# Patient Record
Sex: Male | Born: 1990 | Race: White | Hispanic: No | Marital: Single | Smoking: Never smoker
Health system: Southern US, Community
[De-identification: ages and names within clinical notes are randomized; demographics above are authoritative.]

## PROBLEM LIST (undated history)

## (undated) DIAGNOSIS — L709 Acne, unspecified: Secondary | ICD-10-CM

## (undated) DIAGNOSIS — T7840XA Allergy, unspecified, initial encounter: Secondary | ICD-10-CM

## (undated) HISTORY — DX: Allergy, unspecified, initial encounter: T78.40XA

## (undated) HISTORY — DX: Acne, unspecified: L70.9

---
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
## 2013-01-27 ENCOUNTER — Encounter: Payer: Self-pay | Admitting: Internal Medicine

## 2013-01-27 DIAGNOSIS — T7840XA Allergy, unspecified, initial encounter: Secondary | ICD-10-CM | POA: Insufficient documentation

## 2013-01-27 DIAGNOSIS — L709 Acne, unspecified: Secondary | ICD-10-CM | POA: Insufficient documentation

## 2013-02-01 ENCOUNTER — Ambulatory Visit: Payer: Self-pay | Admitting: Internal Medicine

## 2013-02-21 ENCOUNTER — Encounter: Payer: Self-pay | Admitting: Internal Medicine

## 2013-02-21 NOTE — Progress Notes (Signed)
Patient ID: Todd Schwartz, male   DOB: August 31, 1990, 22 y.o.   MRN: [id number]  This encounter was created in error - please disregard.

## 2013-05-01 ENCOUNTER — Ambulatory Visit (INDEPENDENT_AMBULATORY_CARE_PROVIDER_SITE_OTHER): Payer: BC Managed Care – PPO | Admitting: Physician Assistant

## 2013-05-01 ENCOUNTER — Encounter: Payer: Self-pay | Admitting: Physician Assistant

## 2013-05-01 VITALS — BP 120/78 | HR 80 | Temp 98.1°F | Resp 16 | Ht 73.0 in | Wt 175.0 lb

## 2013-05-01 DIAGNOSIS — J029 Acute pharyngitis, unspecified: Secondary | ICD-10-CM

## 2013-05-01 LAB — POCT RAPID STREP A (OFFICE): RAPID STREP A SCREEN: NEGATIVE

## 2013-05-01 MED ORDER — ACETAMINOPHEN-CODEINE #3 300-30 MG PO TABS: 1.0000 | ORAL_TABLET | Freq: Four times a day (QID) | ORAL | Status: DC | PRN

## 2013-05-01 MED ORDER — AZITHROMYCIN 250 MG PO TABS: 250.0000 mg | ORAL_TABLET | Freq: Every day | ORAL | Status: DC

## 2013-05-01 NOTE — Progress Notes (Signed)
   Subjective:    Patient ID: Todd Schwartz, male    DOB: 20-Jan-1991, 22 y.o.   MRN: [id number]  Sore Throat  This is a new problem. The current episode started yesterday. The problem has been gradually worsening. The maximum temperature recorded prior to his arrival was 102 - 102.9 F. The fever has been present for less than 1 day. Associated symptoms include headaches, swollen glands, trouble swallowing and vomiting (once). Pertinent negatives include no abdominal pain, congestion, coughing, diarrhea, drooling, ear discharge, ear pain, hoarse voice, plugged ear sensation, neck pain, shortness of breath or stridor. Exposure to: Pneumonia. He has tried NSAIDs for the symptoms. The treatment provided mild relief.    Review of Systems  Constitutional: Positive for fever and chills. Negative for fatigue.  HENT: Positive for sore throat and trouble swallowing. Negative for congestion, drooling, ear discharge, ear pain, hoarse voice, mouth sores, nosebleeds, postnasal drip, rhinorrhea and sinus pressure.   Eyes: Negative.   Respiratory: Negative for cough, chest tightness, shortness of breath and stridor.   Cardiovascular: Negative.   Gastrointestinal: Positive for vomiting (once). Negative for abdominal pain and diarrhea.  Genitourinary: Negative.   Musculoskeletal: Negative for neck pain.  Neurological: Positive for headaches.       Objective:   Physical Exam  Constitutional: He appears well-developed and well-nourished.  HENT:  Head: Normocephalic and atraumatic.  Right Ear: External ear normal.  Left Ear: External ear normal.  Mouth/Throat: Uvula is midline and mucous membranes are normal. Posterior oropharyngeal edema and posterior oropharyngeal erythema present.  Eyes: Conjunctivae and EOM are normal. Pupils are equal, round, and reactive to light.  Neck: Normal range of motion. Neck supple.  Cardiovascular: Normal rate, regular rhythm and normal heart sounds.   Pulmonary/Chest:  Effort normal and breath sounds normal.  Abdominal: Soft. Bowel sounds are normal.  Lymphadenopathy:    He has cervical adenopathy.  Skin: Skin is warm and dry.       Assessment & Plan:  Acute pharyngitis - Plan: azithromycin (ZITHROMAX) 250 MG tablet, POCT rapid strep A, acetaminophen-codeine (TYLENOL #3) 300-30 MG per tablet

## 2013-05-01 NOTE — Patient Instructions (Signed)
Pharyngitis °Pharyngitis is redness, pain, and swelling (inflammation) of your pharynx.  °CAUSES  °Pharyngitis is usually caused by infection. Most of the time, these infections are from viruses (viral) and are part of a cold. However, sometimes pharyngitis is caused by bacteria (bacterial). Pharyngitis can also be caused by allergies. Viral pharyngitis may be spread from person to person by coughing, sneezing, and personal items or utensils (cups, forks, spoons, toothbrushes). Bacterial pharyngitis may be spread from person to person by more intimate contact, such as kissing.  °SIGNS AND SYMPTOMS  °Symptoms of pharyngitis include:   °· Sore throat.   °· Tiredness (fatigue).   °· Low-grade fever.   °· Headache. °· Joint pain and muscle aches. °· Skin rashes. °· Swollen lymph nodes. °· Plaque-like film on throat or tonsils (often seen with bacterial pharyngitis). °DIAGNOSIS  °Your health care provider will ask you questions about your illness and your symptoms. Your medical history, along with a physical exam, is often all that is needed to diagnose pharyngitis. Sometimes, a rapid strep test is done. Other lab tests may also be done, depending on the suspected cause.  °TREATMENT  °Viral pharyngitis will usually get better in 3 4 days without the use of medicine. Bacterial pharyngitis is treated with medicines that kill germs (antibiotics).  °HOME CARE INSTRUCTIONS  °· Drink enough water and fluids to keep your urine clear or pale yellow.   °· Only take over-the-counter or prescription medicines as directed by your health care provider:   °· If you are prescribed antibiotics, make sure you finish them even if you start to feel better.   °· Do not take aspirin.   °· Get lots of rest.   °· Gargle with 8 oz of salt water (½ tsp of salt per 1 qt of water) as often as every 1 2 hours to soothe your throat.   °· Throat lozenges (if you are not at risk for choking) or sprays may be used to soothe your throat. °SEEK MEDICAL  CARE IF:  °· You have large, tender lumps in your neck. °· You have a rash. °· You cough up green, yellow-brown, or bloody spit. °SEEK IMMEDIATE MEDICAL CARE IF:  °· Your neck becomes stiff. °· You drool or are unable to swallow liquids. °· You vomit or are unable to keep medicines or liquids down. °· You have severe pain that does not go away with the use of recommended medicines. °· You have trouble breathing (not caused by a stuffy nose). °MAKE SURE YOU:  °· Understand these instructions. °· Will watch your condition. °· Will get help right away if you are not doing well or get worse. °Document Released: 02/02/2005 Document Revised: 11/23/2012 Document Reviewed: 10/10/2012 °[employer]® Patient Information ©2014 [employer]. ° °

## 2013-07-25 ENCOUNTER — Ambulatory Visit (INDEPENDENT_AMBULATORY_CARE_PROVIDER_SITE_OTHER): Payer: BC Managed Care – PPO | Admitting: Internal Medicine

## 2013-07-25 ENCOUNTER — Encounter: Payer: Self-pay | Admitting: Internal Medicine

## 2013-07-25 VITALS — BP 126/76 | HR 64 | Temp 99.2°F | Resp 16 | Ht 73.75 in | Wt 172.8 lb

## 2013-07-25 DIAGNOSIS — R7402 Elevation of levels of lactic acid dehydrogenase (LDH): Secondary | ICD-10-CM

## 2013-07-25 DIAGNOSIS — Z79899 Other long term (current) drug therapy: Secondary | ICD-10-CM

## 2013-07-25 DIAGNOSIS — Z0000 Encounter for general adult medical examination without abnormal findings: Secondary | ICD-10-CM

## 2013-07-25 DIAGNOSIS — Z111 Encounter for screening for respiratory tuberculosis: Secondary | ICD-10-CM

## 2013-07-25 DIAGNOSIS — Z113 Encounter for screening for infections with a predominantly sexual mode of transmission: Secondary | ICD-10-CM

## 2013-07-25 DIAGNOSIS — E559 Vitamin D deficiency, unspecified: Secondary | ICD-10-CM | POA: Insufficient documentation

## 2013-07-25 DIAGNOSIS — R740 Nonspecific elevation of levels of transaminase and lactic acid dehydrogenase [LDH]: Secondary | ICD-10-CM

## 2013-07-25 LAB — CBC WITH DIFFERENTIAL/PLATELET
BASOS ABS: 0 10*3/uL (ref 0.0–0.1)
BASOS PCT: 0 % (ref 0–1)
EOS ABS: 0.2 10*3/uL (ref 0.0–0.7)
Eosinophils Relative: 3 % (ref 0–5)
HCT: 47 % (ref 39.0–52.0)
Hemoglobin: 16.7 g/dL (ref 13.0–17.0)
Lymphocytes Relative: 28 % (ref 12–46)
Lymphs Abs: 2.2 10*3/uL (ref 0.7–4.0)
MCH: 32.2 pg (ref 26.0–34.0)
MCHC: 35.5 g/dL (ref 30.0–36.0)
MCV: 90.6 fL (ref 78.0–100.0)
Monocytes Absolute: 0.9 10*3/uL (ref 0.1–1.0)
Monocytes Relative: 11 % (ref 3–12)
Neutro Abs: 4.6 10*3/uL (ref 1.7–7.7)
Neutrophils Relative %: 58 % (ref 43–77)
PLATELETS: 254 10*3/uL (ref 150–400)
RBC: 5.19 MIL/uL (ref 4.22–5.81)
RDW: 14.2 % (ref 11.5–15.5)
WBC: 7.9 10*3/uL (ref 4.0–10.5)

## 2013-07-25 NOTE — Patient Instructions (Signed)

## 2013-07-25 NOTE — Progress Notes (Signed)
Patient ID: Todd Schwartz, male   DOB: 02-01-1991, 23 y.o.   MRN: [id number]   Annual Screening Comprehensive Examination   This very nice 23 y.o.male presents for complete physical.   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 Patient has no major health issues. He has been treated in the past for muscle contraction or tension type HA's. He does report recently being more fatigued and having difficulty focusing and concentrating.   Finally, patient has history of Vitamin D Deficiency of 41  On Tx in 2012 and last vitamin D was 44 in 07/2012 off of Tx. Currently takes very sporadically.  Medication Sig  .  (EXCEDRIN MIGRAINE) 250-250-65 MG (APAP-ASA-Caf) Take by mouth every 6 (six) hours as needed for headache.  Marland Kitchen. VITAMIN D 2000 UNITS cap Take 2,000 Units by mouth 2  times daily.   No Known Allergies  Past Medical History  Diagnosis Date  . Allergy   . Acne     No past surgical history on file.  Family History  Problem Relation Age of Onset  . Hypertension Father   . Cancer Paternal Grandfather     prostate   History   Social History  . Marital Status: Single    Spouse Name: N/A    Number of Children: N/A  . Years of Education: N/A   Occupational History  . 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   Social History Main Topics  . Smoking status: Never Smoker   . Smokeless tobacco: Not on file  . Alcohol Use: Not on file  . Drug Use: Not on file  . Sexual Activity: Active    ROS Constitutional: Denies fever, chills, weight loss/gain, headaches, insomnia, fatigue, night sweats, and change in appetite. Eyes: Denies redness, blurred vision, diplopia, discharge, itchy, watery eyes.  ENT: Denies discharge, congestion, post nasal drip, epistaxis, sore throat, earache, hearing loss, dental pain, Tinnitus, Vertigo, Sinus pain, snoring.  Cardio: Denies chest pain, palpitations, irregular heartbeat,  syncope, dyspnea, diaphoresis, orthopnea, PND, claudication, edema Respiratory: denies cough, dyspnea, DOE, pleurisy, hoarseness, laryngitis, wheezing.  Gastrointestinal: Denies dysphagia, heartburn, reflux, water brash, pain, cramps, nausea, vomiting, bloating, diarrhea, constipation, hematemesis, melena, hematochezia, jaundice, hemorrhoids Genitourinary: Denies dysuria, frequency, urgency, nocturia, hesitancy, discharge, hematuria, flank pain Musculoskeletal: Denies arthralgia, myalgia, stiffness, Jt. Swelling, pain, limp, and strain/sprain. Skin: Denies puritis, rash, hives, warts, acne, eczema, changing in skin lesion Neuro: Weakness, tremor, incoordination, spasms, paresthesia, pain Psychiatric: Denies confusion, memory loss, sensory loss. Endocrine: Denies change in weight, skin, hair change, nocturia, and paresthesia, diabetic polys, visual blurring, hyper /hypo glycemic episodes.  Heme/Lymph: No excessive bleeding, bruising, enlarged lymph nodes.  Physical Exam  BP 126/76  P 64  T 99.2 F   Resp 16  Ht 6' 1.75" Wt 172 lb 12.8 oz   BMI 22.34 kg/m2  General Appearance: Well nourished, in no apparent distress. Eyes: PERRLA, EOMs, conjunctiva no swelling or erythema, normal fundi and vessels. Sinuses: No frontal/maxillary tenderness ENT/Mouth: EACs patent / TMs  nl. Nares clear without erythema, swelling, mucoid exudates. Oral hygiene is good. No erythema, swelling, or exudate. Tongue normal, non-obstructing. Tonsils not swollen or erythematous. Hearing normal.  Neck: Supple, thyroid normal. No bruits, nodes or JVD. Respiratory: Respiratory effort normal.  BS equal and clear bilateral without rales, rhonci, wheezing or stridor. Cardio: Heart sounds are normal with regular rate and rhythm and no murmurs, rubs or gallops. Peripheral pulses  are normal and equal bilaterally without edema. No aortic or femoral bruits. Chest: symmetric with normal excursions and percussion. Abdomen: Flat,  soft, with bowl sounds. Nontender, no guarding, rebound, hernias, masses, or organomegaly.  Lymphatics: Non tender without lymphadenopathy.  Genitourinary:  Nl male . Testes nl. No hernia. Musculoskeletal: Full ROM all peripheral extremities, joint stability, 5/5 strength, and normal gait. Skin: Warm and dry without rashes, lesions, cyanosis, clubbing or  ecchymosis.  Neuro: Cranial nerves intact, reflexes equal bilaterally. Normal muscle tone, no cerebellar symptoms. Sensation intact.  Pysch: Awake and oriented X 3, normal affect, Insight and Judgment appropriate.   Assessment and Plan  1. Annual Screening Examination 2. Muscle Contraction Type HA 3. Vitamin D Deficiency   Continue prudent diet as discussed, weight control, regular exercise, and medications. Routine screening labs and tests as requested with regular follow-up as recommended.

## 2013-07-26 LAB — HIV ANTIBODY (ROUTINE TESTING W REFLEX): HIV: NONREACTIVE

## 2013-07-26 LAB — HEPATITIS A ANTIBODY, TOTAL: HEP A TOTAL AB: REACTIVE — AB

## 2013-07-26 LAB — HEPATIC FUNCTION PANEL
ALT: 12 U/L (ref 0–53)
AST: 17 U/L (ref 0–37)
Albumin: 4.9 g/dL (ref 3.5–5.2)
Alkaline Phosphatase: 50 U/L (ref 39–117)
BILIRUBIN DIRECT: 0.1 mg/dL (ref 0.0–0.3)
BILIRUBIN TOTAL: 0.7 mg/dL (ref 0.2–1.2)
Indirect Bilirubin: 0.6 mg/dL (ref 0.2–1.2)
Total Protein: 7.9 g/dL (ref 6.0–8.3)

## 2013-07-26 LAB — URINALYSIS, MICROSCOPIC ONLY
Bacteria, UA: NONE SEEN
CRYSTALS: NONE SEEN
Casts: NONE SEEN
SQUAMOUS EPITHELIAL / LPF: NONE SEEN

## 2013-07-26 LAB — LIPID PANEL
CHOL/HDL RATIO: 3.1 ratio
CHOLESTEROL: 197 mg/dL (ref 0–200)
HDL: 63 mg/dL (ref >39)
LDL Cholesterol: 94 mg/dL (ref 0–99)
TRIGLYCERIDES: 202 mg/dL — AB (ref <150)
VLDL: 40 mg/dL (ref 0–40)

## 2013-07-26 LAB — TSH: TSH: 0.778 u[IU]/mL (ref 0.350–4.500)

## 2013-07-26 LAB — BASIC METABOLIC PANEL WITH GFR
BUN: 12 mg/dL (ref 6–23)
CALCIUM: 9.7 mg/dL (ref 8.4–10.5)
CO2: 25 mEq/L (ref 19–32)
Chloride: 99 mEq/L (ref 96–112)
Creat: 0.94 mg/dL (ref 0.50–1.35)
GLUCOSE: 80 mg/dL (ref 70–99)
Potassium: 4.2 mEq/L (ref 3.5–5.3)
SODIUM: 140 meq/L (ref 135–145)

## 2013-07-26 LAB — HEMOGLOBIN A1C
HEMOGLOBIN A1C: 5.1 % (ref <5.7)
Mean Plasma Glucose: 100 mg/dL (ref <117)

## 2013-07-26 LAB — HEPATITIS B CORE ANTIBODY, TOTAL: HEP B C TOTAL AB: NONREACTIVE

## 2013-07-26 LAB — MICROALBUMIN / CREATININE URINE RATIO
Creatinine, Urine: 230.5 mg/dL
Microalb Creat Ratio: 7.1 mg/g (ref 0.0–30.0)
Microalb, Ur: 1.64 mg/dL (ref 0.00–1.89)

## 2013-07-26 LAB — INSULIN, FASTING: Insulin fasting, serum: 19 u[IU]/mL (ref 3–28)

## 2013-07-26 LAB — MAGNESIUM: MAGNESIUM: 2.1 mg/dL (ref 1.5–2.5)

## 2013-07-26 LAB — VITAMIN D 25 HYDROXY (VIT D DEFICIENCY, FRACTURES): VIT D 25 HYDROXY: 56 ng/mL (ref 30–89)

## 2013-07-26 LAB — RPR

## 2013-07-26 LAB — TESTOSTERONE: Testosterone: 505 ng/dL (ref 300–890)

## 2013-07-26 LAB — HEPATITIS B SURFACE ANTIBODY,QUALITATIVE: Hep B S Ab: NEGATIVE

## 2013-07-26 LAB — HEPATITIS C ANTIBODY: HCV AB: NEGATIVE

## 2013-07-26 LAB — VITAMIN B12: VITAMIN B 12: 292 pg/mL (ref 211–911)

## 2013-07-27 LAB — HEPATITIS B E ANTIBODY: HEPATITIS BE ANTIBODY: NONREACTIVE

## 2013-08-02 LAB — TB SKIN TEST
Induration: 0 mm
TB SKIN TEST: NEGATIVE

## 2014-07-26 ENCOUNTER — Encounter: Payer: Self-pay | Admitting: Internal Medicine

## 2014-08-31 ENCOUNTER — Encounter: Payer: Self-pay | Admitting: Internal Medicine

## 2014-08-31 ENCOUNTER — Ambulatory Visit (INDEPENDENT_AMBULATORY_CARE_PROVIDER_SITE_OTHER): Payer: Managed Care, Other (non HMO) | Admitting: Internal Medicine

## 2014-08-31 VITALS — BP 116/76 | HR 88 | Temp 97.9°F | Resp 16 | Ht 73.75 in | Wt 176.6 lb

## 2014-08-31 DIAGNOSIS — Z111 Encounter for screening for respiratory tuberculosis: Secondary | ICD-10-CM

## 2014-08-31 DIAGNOSIS — Z23 Encounter for immunization: Secondary | ICD-10-CM

## 2014-08-31 DIAGNOSIS — R5383 Other fatigue: Secondary | ICD-10-CM | POA: Insufficient documentation

## 2014-08-31 DIAGNOSIS — E559 Vitamin D deficiency, unspecified: Secondary | ICD-10-CM

## 2014-08-31 DIAGNOSIS — Z79899 Other long term (current) drug therapy: Secondary | ICD-10-CM

## 2014-08-31 DIAGNOSIS — R7303 Prediabetes: Secondary | ICD-10-CM

## 2014-08-31 DIAGNOSIS — E7800 Pure hypercholesterolemia, unspecified: Secondary | ICD-10-CM

## 2014-08-31 DIAGNOSIS — R7309 Other abnormal glucose: Secondary | ICD-10-CM

## 2014-08-31 DIAGNOSIS — Z0000 Encounter for general adult medical examination without abnormal findings: Secondary | ICD-10-CM

## 2014-08-31 DIAGNOSIS — Z6822 Body mass index (BMI) 22.0-22.9, adult: Secondary | ICD-10-CM

## 2014-08-31 LAB — BASIC METABOLIC PANEL WITH GFR
BUN: 6 mg/dL (ref 6–23)
CHLORIDE: 108 meq/L (ref 96–112)
CO2: 25 mEq/L (ref 19–32)
Calcium: 9.2 mg/dL (ref 8.4–10.5)
Creat: 0.82 mg/dL (ref 0.50–1.35)
GFR, Est African American: >89 mL/min
GFR, Est Non African American: >89 mL/min
Glucose, Bld: 90 mg/dL (ref 70–99)
Potassium: 3.9 mEq/L (ref 3.5–5.3)
Sodium: 144 mEq/L (ref 135–145)

## 2014-08-31 LAB — HEPATIC FUNCTION PANEL
ALT: 12 U/L (ref 0–53)
AST: 16 U/L (ref 0–37)
Albumin: 4.5 g/dL (ref 3.5–5.2)
Alkaline Phosphatase: 43 U/L (ref 39–117)
Bilirubin, Direct: 0.1 mg/dL (ref 0.0–0.3)
Indirect Bilirubin: 0.2 mg/dL (ref 0.2–1.2)
Total Bilirubin: 0.3 mg/dL (ref 0.2–1.2)
Total Protein: 7.3 g/dL (ref 6.0–8.3)

## 2014-08-31 LAB — CBC WITH DIFFERENTIAL/PLATELET
Basophils Absolute: 0 10*3/uL (ref 0.0–0.1)
Basophils Relative: 0 % (ref 0–1)
Eosinophils Absolute: 0.1 10*3/uL (ref 0.0–0.7)
Eosinophils Relative: 1 % (ref 0–5)
HEMATOCRIT: 46.6 % (ref 39.0–52.0)
Hemoglobin: 15.9 g/dL (ref 13.0–17.0)
Lymphocytes Relative: 38 % (ref 12–46)
Lymphs Abs: 1.9 10*3/uL (ref 0.7–4.0)
MCH: 31.5 pg (ref 26.0–34.0)
MCHC: 34.1 g/dL (ref 30.0–36.0)
MCV: 92.3 fL (ref 78.0–100.0)
MONO ABS: 0.5 10*3/uL (ref 0.1–1.0)
MONOS PCT: 9 % (ref 3–12)
MPV: 9.9 fL (ref 8.6–12.4)
NEUTROS ABS: 2.7 10*3/uL (ref 1.7–7.7)
Neutrophils Relative %: 52 % (ref 43–77)
PLATELETS: 234 10*3/uL (ref 150–400)
RBC: 5.05 MIL/uL (ref 4.22–5.81)
RDW: 13.7 % (ref 11.5–15.5)
WBC: 5.1 10*3/uL (ref 4.0–10.5)

## 2014-08-31 LAB — LIPID PANEL
Cholesterol: 165 mg/dL (ref 0–200)
HDL: 42 mg/dL (ref >=40)
LDL CALC: 79 mg/dL (ref 0–99)
TRIGLYCERIDES: 218 mg/dL — AB (ref <150)
Total CHOL/HDL Ratio: 3.9 Ratio
VLDL: 44 mg/dL — AB (ref 0–40)

## 2014-08-31 LAB — IRON AND TIBC
%SAT: 27 % (ref 20–55)
Iron: 92 ug/dL (ref 42–165)
TIBC: 337 ug/dL (ref 215–435)
UIBC: 245 ug/dL (ref 125–400)

## 2014-08-31 LAB — TSH: TSH: 1.192 u[IU]/mL (ref 0.350–4.500)

## 2014-08-31 LAB — MAGNESIUM: Magnesium: 2.1 mg/dL (ref 1.5–2.5)

## 2014-08-31 LAB — HEMOGLOBIN A1C
Hgb A1c MFr Bld: 5.3 % (ref <5.7)
MEAN PLASMA GLUCOSE: 105 mg/dL (ref <117)

## 2014-08-31 LAB — VITAMIN B12: VITAMIN B 12: 315 pg/mL (ref 211–911)

## 2014-08-31 NOTE — Progress Notes (Signed)
Patient ID: Todd Schwartz, male   DOB: March 16, 1990, 24 y.o.   MRN: [id number]  Annual Screening Comprehensive Examination   This very nice 24 y.o. SWM presents for complete physical.  Patient has no major health issues. He does report some general fatigue with 10 point systems review negative    Finally, patient has history of Vitamin D Deficiency and last vitamin D was 56 in June 2015.   Medication Sig  . aspirin-acetaminophen-caffeine (EXCEDRIN MIGRAINE) 250-250-65  Take every 6 hours as needed for headache.  Marland Kitchen. VITAMIN D 2000 UNITS  Take 2,000 Units  2  times daily.   No Known Allergies  Past Medical History  Diagnosis Date  . Allergy   . Acne    Immunization History  Administered Date(s) Administered  . HPV Quadrivalent 07/16/2011, 09/15/2011, 07/21/2012  . PPD Test 07/25/2013  . Pneumococcal Polysaccharide-23 07/07/2010  . Tdap 02/16/2005   Family History  Problem Relation Age of Onset  . Hypertension Father   . Cancer Paternal Grandfather     prostate   History   Social History  . Marital Status: Single    Spouse Name: N/A  . Number of Children: N/A  . Years of Education: N/A   Occupational History  . Just completed college and to enter [employer] program in Education @ [employer]   Social History Main Topics  . Smoking status: Never Smoker   . Smokeless tobacco: Not on file  . Alcohol Use: Not on file  . Drug Use: Not on file  . Sexual Activity: Active    ROS Constitutional: Denies fever, chills, weight loss/gain, headaches, insomnia, fatigue, night sweats, and change in appetite. Eyes: Denies redness, blurred vision, diplopia, discharge, itchy, watery eyes.  ENT: Denies discharge, congestion, post nasal drip, epistaxis, sore throat, earache, hearing loss, dental pain, Tinnitus, Vertigo, Sinus pain, snoring.  Cardio: Denies chest pain, palpitations, irregular heartbeat, syncope, dyspnea, diaphoresis, orthopnea, PND, claudication, edema Respiratory: denies cough,  dyspnea, DOE, pleurisy, hoarseness, laryngitis, wheezing.  Gastrointestinal: Denies dysphagia, heartburn, reflux, water brash, pain, cramps, nausea, vomiting, bloating, diarrhea, constipation, hematemesis, melena, hematochezia, jaundice, hemorrhoids Genitourinary: Denies dysuria, frequency, urgency, nocturia, hesitancy, discharge, hematuria, flank pain Breast: Breast lumps, nipple discharge, bleeding.  Musculoskeletal: Denies arthralgia, myalgia, stiffness, Jt. Swelling, pain, limp, and strain/sprain. Skin: Denies puritis, rash, hives, warts, acne, eczema, changing in skin lesion Neuro: Weakness, tremor, incoordination, spasms, paresthesia, pain Psychiatric: Denies confusion, memory loss, sensory loss. Endocrine: Denies change in weight, skin, hair change, nocturia, and paresthesia, diabetic polys, visual blurring, hyper /hypo glycemic episodes.  Heme/Lymph: No excessive bleeding, bruising, enlarged lymph nodes. Physical Exam  BP 116/76   Pulse 88  Temp97.9 F   Resp 16  Ht 6' 1.75" (  Wt 176 lb 9.6 oz     BMI 22.83   General Appearance: Well nourished and in no apparent distress.  Eyes: PERRLA, EOMs, conjunctiva no swelling or erythema, normal fundi and vessels. Sinuses: No frontal/maxillary tenderness ENT/Mouth: EACs patent / TMs  nl. Nares clear without erythema, swelling, mucoid exudates. Oral hygiene is good. No erythema, swelling, or exudate. Tongue normal, non-obstructing. Tonsils not swollen or erythematous. Hearing normal.  Neck: Supple, thyroid normal. No bruits, nodes or JVD. Respiratory: Respiratory effort normal.  BS equal and clear bilateral without rales, rhonci, wheezing or stridor. Cardio: Heart sounds are normal with regular rate and rhythm and no murmurs, rubs or gallops. Peripheral pulses are normal and equal bilaterally without edema. No aortic or femoral bruits. Chest: symmetric with normal excursions and  percussion. Abdomen: Flat, soft, with bowl sounds. Nontender,  no guarding, rebound, hernias, masses, or organomegaly.  Lymphatics: Non tender without lymphadenopathy.  Genitourinary: No hernia. Nl Male.  Musculoskeletal: Full ROM all peripheral extremities, joint stability, 5/5 strength, and normal gait. Skin: Warm and dry without rashes, lesions, cyanosis, clubbing or  ecchymosis.  Neuro: Cranial nerves intact, reflexes equal bilaterally. Normal muscle tone, no cerebellar symptoms. Sensation intact.  Pysch: Awake and oriented X 3, normal affect, Insight and Judgment appropriate.   Assessment and Plan  1. Other fatigue  - Urine Microscopic - EKG 12-Lead - Vitamin B12 - Iron and TIBC - CBC with Differential/Platelet - Hepatic function panel - TSH - Hemoglobin A1c - Insulin, random - Testosterone  2. Elevated LDL cholesterol level  - Lipid panel - Vit D  25 hydroxy   3. Vitamin D deficiency   4. Medication management  - BASIC METABOLIC PANEL WITH GFR - Magnesium  5. Body mass index (BMI) of 22.0-22.9 in adult   6. Need for tuberculosis vaccination  - PPD  Continue prudent diet as discussed, weight control, regular exercise, and medications. Routine screening labs and tests as requested with regular follow-up as recommended.

## 2014-08-31 NOTE — Patient Instructions (Signed)

## 2014-09-01 LAB — URINALYSIS, MICROSCOPIC ONLY
Bacteria, UA: NONE SEEN
Casts: NONE SEEN
Crystals: NONE SEEN
Squamous Epithelial / LPF: NONE SEEN

## 2014-09-01 LAB — TESTOSTERONE: Testosterone: 303 ng/dL (ref 300–890)

## 2014-09-01 LAB — INSULIN, RANDOM: Insulin: 13.3 u[IU]/mL (ref 2.0–19.6)

## 2014-09-01 LAB — VITAMIN D 25 HYDROXY (VIT D DEFICIENCY, FRACTURES): VIT D 25 HYDROXY: 33 ng/mL (ref 30–100)

## 2014-09-03 LAB — TB SKIN TEST
Induration: 0 mm
TB Skin Test: NEGATIVE

## 2015-08-14 ENCOUNTER — Ambulatory Visit (INDEPENDENT_AMBULATORY_CARE_PROVIDER_SITE_OTHER): Payer: Managed Care, Other (non HMO) | Admitting: Internal Medicine

## 2015-08-14 ENCOUNTER — Encounter: Payer: Self-pay | Admitting: Internal Medicine

## 2015-08-14 VITALS — BP 114/80 | HR 76 | Temp 97.5°F | Resp 16 | Ht 74.0 in | Wt 177.4 lb

## 2015-08-14 DIAGNOSIS — R6889 Other general symptoms and signs: Secondary | ICD-10-CM | POA: Diagnosis not present

## 2015-08-14 DIAGNOSIS — Z23 Encounter for immunization: Secondary | ICD-10-CM | POA: Diagnosis not present

## 2015-08-14 DIAGNOSIS — E7800 Pure hypercholesterolemia, unspecified: Secondary | ICD-10-CM

## 2015-08-14 DIAGNOSIS — Z111 Encounter for screening for respiratory tuberculosis: Secondary | ICD-10-CM

## 2015-08-14 DIAGNOSIS — R030 Elevated blood-pressure reading, without diagnosis of hypertension: Secondary | ICD-10-CM

## 2015-08-14 DIAGNOSIS — Z79899 Other long term (current) drug therapy: Secondary | ICD-10-CM

## 2015-08-14 DIAGNOSIS — E559 Vitamin D deficiency, unspecified: Secondary | ICD-10-CM | POA: Diagnosis not present

## 2015-08-14 DIAGNOSIS — IMO0001 Reserved for inherently not codable concepts without codable children: Secondary | ICD-10-CM

## 2015-08-14 DIAGNOSIS — Z0001 Encounter for general adult medical examination with abnormal findings: Secondary | ICD-10-CM | POA: Diagnosis not present

## 2015-08-14 DIAGNOSIS — R7309 Other abnormal glucose: Secondary | ICD-10-CM

## 2015-08-14 DIAGNOSIS — R5383 Other fatigue: Secondary | ICD-10-CM

## 2015-08-14 LAB — CBC WITH DIFFERENTIAL/PLATELET
BASOS PCT: 0 %
Basophils Absolute: 0 cells/uL (ref 0–200)
EOS PCT: 2 %
Eosinophils Absolute: 128 cells/uL (ref 15–500)
HEMATOCRIT: 46.1 % (ref 38.5–50.0)
HEMOGLOBIN: 15.9 g/dL (ref 13.2–17.1)
LYMPHS ABS: 1728 {cells}/uL (ref 850–3900)
Lymphocytes Relative: 27 %
MCH: 31.9 pg (ref 27.0–33.0)
MCHC: 34.5 g/dL (ref 32.0–36.0)
MCV: 92.4 fL (ref 80.0–100.0)
MONO ABS: 832 {cells}/uL (ref 200–950)
MPV: 9.4 fL (ref 7.5–12.5)
Monocytes Relative: 13 %
NEUTROS ABS: 3712 {cells}/uL (ref 1500–7800)
NEUTROS PCT: 58 %
Platelets: 215 10*3/uL (ref 140–400)
RBC: 4.99 MIL/uL (ref 4.20–5.80)
RDW: 13.8 % (ref 11.0–15.0)
WBC: 6.4 10*3/uL (ref 3.8–10.8)

## 2015-08-14 LAB — VITAMIN B12: VITAMIN B 12: 356 pg/mL (ref 200–1100)

## 2015-08-14 LAB — TSH: TSH: 1.4 mIU/L (ref 0.40–4.50)

## 2015-08-14 NOTE — Progress Notes (Signed)
Patient ID: Todd KitchensCarter Renton, male   DOB: 1990-03-14, 25 y.o.   MRN: [id number]  [facility]   Lucky CowboyWilliam Cayley Pester, M.D.    Dyanne CarrelAmanda R. Steffanie Dunnollier, P.A.-C      Terri Piedraourtney Forcucci, P.A.-C   [facility]                [address]                [place] [id number] Telephone [phone] Telefax [phone] _________________________________  Annual  Screening/Preventative Visit And Comprehensive Evaluation & Examination     This very nice 25 y.o. Single WM presents for a Wellness/Preventative Visit & comprehensive evaluation and management of multiple medical co-morbidities.  Patient is being screened for labile  HTN, abnormal glucose, Hyperlipidemia and Vitamin D Deficiency.     Patient is screened proactively for elevated BP and his  BP has been controlled and today's BP is  114/80 mmHg. Patient denies any cardiac symptoms as chest pain, palpitations, shortness of breath, dizziness or ankle swelling.     Patient's hyperlipidemia is controlled with diet and medications. Patient denies myalgias or other medication SE's. Last lipids were at goal with  Cholesterol 165; HDL 42; LDL 79; but elevated Triglycerides 218 in July 2016.     Patient is screened proactively for prediabetes and patient denies reactive hypoglycemic symptoms, visual blurring, diabetic polys or paresthesias. Last A1c was 5.3% in July 2016.        Finally, patient has history of Vitamin D Deficiency  and last vitamin D was  33 on 08/31/2014.   Medication Sig  . EXCEDRIN MIGRAINE  Take by mouth every 6 (six) hours as needed for headache.  Marland Kitchen. VITAMIN D 2000 UNITS tablet Take 2,000 Units by mouth 2 (two) times daily.   No Known Allergies   Past Medical History  Diagnosis Date  . Allergy   . Acne    Health Maintenance  Topic Date Due  . TETANUS/TDAP  05/12/2011  . INFLUENZA VACCINE  09/17/2015  . HIV Screening  Completed   Immunization History   Administered Date(s) Administered  . HPV Quadrivalent 07/16/2011, 09/15/2011, 07/21/2012  . PPD Test 07/25/2013, 08/31/2014, 08/14/2015  . Pneumococcal Polysaccharide-23 07/07/2010  . Td 08/14/2015  . Tdap 02/16/2005   No past surgical history on file.  Family History  Problem Relation Age of Onset  . Hypertension Father   . Cancer Paternal Grandfather     prostate   Social History   Social History  . Marital Status: Single    Spouse Name: N/A  . Number of Children: N/A  . Years of Education: N/A   Occupational History  . 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    Social History Main Topics  . Smoking status: Never Smoker   . Smokeless tobacco: Not on file  . Alcohol Use: Not on file  . Drug Use: Not on file  . Sexual Activity: Not on file    ROS Constitutional: Denies fever, chills, weight loss/gain, headaches, insomnia,  night sweats or change in appetite. Does c/o fatigue. Eyes: Denies redness, blurred vision, diplopia, discharge, itchy or watery eyes.  ENT: Denies discharge, congestion, post nasal drip, epistaxis, sore throat, earache, hearing loss, dental pain, Tinnitus, Vertigo, Sinus pain or snoring.  Cardio: Denies chest pain, palpitations, irregular heartbeat, syncope, dyspnea, diaphoresis, orthopnea, PND, claudication or edema Respiratory: denies cough, dyspnea, DOE,  pleurisy, hoarseness, laryngitis or wheezing.  Gastrointestinal: Denies dysphagia, heartburn, reflux, water brash, pain, cramps, nausea, vomiting, bloating, diarrhea, constipation, hematemesis, melena, hematochezia, jaundice or hemorrhoids Genitourinary: Denies dysuria, frequency, urgency, nocturia, hesitancy, discharge, hematuria or flank pain Musculoskeletal: Denies arthralgia, myalgia, stiffness, Jt. Swelling, pain, limp or strain/sprain. Denies Falls. Skin: Denies puritis, rash, hives, warts, acne, eczema or change in skin  lesion Neuro: No weakness, tremor, incoordination, spasms, paresthesia or pain Psychiatric: Denies confusion, memory loss or sensory loss. Denies Depression. Endocrine: Denies change in weight, skin, hair change, nocturia, and paresthesia, diabetic polys, visual blurring or hyper / hypo glycemic episodes.  Heme/Lymph: No excessive bleeding, bruising or enlarged lymph nodes.  Physical Exam  BP 114/80 mmHg  Pulse 76  Temp(Src) 97.5 F (36.4 C)  Resp 16  Ht 6\' 2"  (1.88 m)  Wt 177 lb 6.4 oz (80.468 kg)  BMI 22.77 kg/m2  General Appearance: Well nourished, in no apparent distress.  Eyes: PERRLA, EOMs, conjunctiva no swelling or erythema, normal fundi and vessels. Sinuses: No frontal/maxillary tenderness ENT/Mouth: EACs patent / TMs  nl. Nares clear without erythema, swelling, mucoid exudates. Oral hygiene is good. No erythema, swelling, or exudate. Tongue normal, non-obstructing. Tonsils not swollen or erythematous. Hearing normal.  Neck: Supple, thyroid normal. No bruits, nodes or JVD. Respiratory: Respiratory effort normal.  BS equal and clear bilateral without rales, rhonci, wheezing or stridor. Cardio: Heart sounds are normal with regular rate and rhythm and no murmurs, rubs or gallops. Peripheral pulses are normal and equal bilaterally without edema. No aortic or femoral bruits. Chest: symmetric with normal excursions and percussion.  Abdomen: Soft, with Nl bowel sounds. Nontender, no guarding, rebound, hernias, masses, or organomegaly.  Lymphatics: Non tender without lymphadenopathy.  Musculoskeletal: Full ROM all peripheral extremities, joint stability, 5/5 strength, and normal gait. Skin: Warm and dry without rashes, lesions, cyanosis, clubbing or  ecchymosis.  Neuro: Cranial nerves intact, reflexes equal bilaterally. Normal muscle tone, no cerebellar symptoms. Sensation intact.  Pysch: Alert and oriented X 3 with normal affect, insight and judgment appropriate.   Assessment and  Plan  1. Annual Preventative/Screening Exam    - Urinalysis, Routine w reflex microscopic - Vitamin B12 - Iron and TIBC - Testosterone - CBC with Differential/Platelet - BASIC METABOLIC PANEL WITH GFR - Hepatic function panel - Magnesium - Lipid panel - TSH - Hemoglobin A1c - Insulin, random - VITAMIN D 25 Hydroxy  - PPD - Td : Tetanus/diphtheria >7yo Preservative  free  2. Elevated BP   3. Elevated LDL cholesterol level  - Lipid panel - TSH  4. Other abnormal glucose  - Hemoglobin A1c - Insulin, random  5. Vitamin D deficiency  - VITAMIN D 25 Hydroxy  6. Medication management  - Urinalysis, Routine w reflex microscopic  - CBC with Differential/Platelet - BASIC METABOLIC PANEL WITH GFR - Hepatic function panel - Magnesium  7. Other fatigue  - Vitamin B12 - Iron and TIBC - Testosterone - CBC with Differential/Platelet - TSH  8. Screening examination for pulmonary tuberculosis  - PPD  9. Need for prophylactic vaccination with tetanus-diphtheria (TD)  - Td : Tetanus/diphtheria >7yo Preservative  free   Continue prudent diet as discussed, weight control, BP monitoring, regular exercise, and medications as discussed.  Discussed med effects and SE's. Routine screening labs and tests as requested with regular follow-up as recommended. Over 40 minutes of exam, counseling, chart review and high complex critical decision making was performed

## 2015-08-14 NOTE — Patient Instructions (Signed)
Recommend Adult Low Dose Aspirin or   coated  Aspirin 81 mg daily   To reduce risk of Colon Cancer 20 %,   Skin Cancer 26 % ,   Melanoma 46%   and   Pancreatic cancer 60%   ++++++++++++++++++++++++++++++++++++++++++++++++++++++ Vitamin D goal   is between 70-100.   Please make sure that you are taking your Vitamin D as directed.   It is very important as a natural anti-inflammatory   helping hair, skin, and nails, as well as reducing stroke and heart attack risk.   It helps your bones and helps with mood.  It also decreases numerous cancer risks so please take it as directed.   Low Vit D is associated with a 200-300% higher risk for CANCER   and 200-300% higher risk for HEART   ATTACK  &  STROKE.   .....................................Todd Schwartz  It is also associated with higher death rate at younger ages,   autoimmune diseases like Rheumatoid arthritis, Lupus, Multiple Sclerosis.     Also many other serious conditions, like depression, Alzheimer's  Dementia, infertility, muscle aches, fatigue, fibromyalgia - just to name a few.  ++++++++++++++++++++++++++++++++++++++++++++++++  Recommend the book "The END of DIETING" by Dr Excell Seltzer   & the book "The END of DIABETES " by Dr Excell Seltzer  At [email or web address] - get book & Audio CD's     Being diabetic has a  300% increased risk for heart attack, stroke, cancer, and alzheimer- type vascular dementia. It is very important that you work harder with diet by avoiding all foods that are white. Avoid white rice (brown & wild rice is OK), white potatoes (sweetpotatoes in moderation is OK), White bread or wheat bread or anything made out of white flour like bagels, donuts, rolls, buns, biscuits, cakes, pastries, cookies, pizza crust, and pasta (made from white flour & egg whites) - vegetarian pasta or spinach or wheat pasta is OK. Multigrain breads like Arnold's or Pepperidge [employer], or multigrain sandwich thins or flatbreads.  Diet,  exercise and weight loss can reverse and cure diabetes in the early stages.  Diet, exercise and weight loss is very important in the control and prevention of complications of diabetes which affects every system in your body, ie. Brain - dementia/stroke, eyes - glaucoma/blindness, heart - heart attack/heart failure, kidneys - dialysis, stomach - gastric paralysis, intestines - malabsorption, nerves - severe painful neuritis, circulation - gangrene & loss of a leg(s), and finally cancer and Alzheimers.    I recommend avoid fried & greasy foods,  sweets/candy, white rice (brown or wild rice or Quinoa is OK), white potatoes (sweet potatoes are OK) - anything made from white flour - bagels, doughnuts, rolls, buns, biscuits,white and wheat breads, pizza crust and traditional pasta made of white flour & egg white(vegetarian pasta or spinach or wheat pasta is OK).  Multi-grain bread is OK - like multi-grain flat bread or sandwich thins. Avoid alcohol in excess. Exercise is also important.    Eat all the vegetables you want - avoid meat, especially red meat and dairy - especially cheese.  Cheese is the most concentrated form of trans-fats which is the worst thing to clog up our arteries. Veggie cheese is OK which can be found in the fresh produce section at [employer] or [employer] or [employer]  ++++++++++++++++++++++++++++++++++++++++++++++++++ DASH Eating Plan  DASH stands for "Dietary Approaches to Stop Hypertension."   The DASH eating plan is a healthy eating plan that has been shown to reduce high blood  pressure (hypertension). Additional health benefits may include reducing the risk of type 2 diabetes mellitus, heart disease, and stroke. The DASH eating plan may also help with weight loss.  WHAT DO I NEED TO KNOW ABOUT THE DASH EATING PLAN?  For the DASH eating plan, you will follow these general guidelines:  Choose foods with a percent daily value for sodium of less than 5% (as listed on the food  label).  Use salt-free seasonings or herbs instead of table salt or sea salt.  Check with your health care provider or pharmacist before using salt substitutes.  Eat lower-sodium products, often labeled as "lower sodium" or "no salt added."  Eat fresh foods.  Eat more vegetables, fruits, and low-fat dairy products.    Choose whole grains. Look for the word "whole" as the first word in the ingredient list.  Choose fish   Limit sweets, desserts, sugars, and sugary drinks.  Choose heart-healthy fats.  Eat veggie cheese   Eat more home-cooked food and less restaurant, buffet, and fast food.  Limit fried foods.  Huffaker foods using methods other than frying.  Limit canned vegetables. If you do use them, rinse them well to decrease the sodium.  When eating at a restaurant, ask that your food be prepared with less salt, or no salt if possible.                      WHAT FOODS CAN I EAT?  Read Dr Fara Olden Fuhrman's books on The End of Dieting & The End of Diabetes  Grains  Whole grain or whole wheat bread. Brown rice. Whole grain or whole wheat pasta. Quinoa, bulgur, and whole grain cereals. Low-sodium cereals. Corn or whole wheat flour tortillas. Whole grain cornbread. Whole grain crackers. Low-sodium crackers.  Vegetables  Fresh or frozen vegetables (raw, steamed, roasted, or grilled). Low-sodium or reduced-sodium tomato and vegetable juices. Low-sodium or reduced-sodium tomato sauce and paste. Low-sodium or reduced-sodium canned vegetables.   Fruits  All fresh, canned (in natural juice), or frozen fruits.  Protein Products   All fish and seafood.  Dried beans, peas, or lentils. Unsalted nuts and seeds. Unsalted canned beans.  Dairy  Low-fat dairy products, such as skim or 1% milk, 2% or reduced-fat cheeses, low-fat ricotta or cottage cheese, or plain low-fat yogurt. Low-sodium or reduced-sodium cheeses.  Fats and Oils  Tub margarines without trans fats. Light or  reduced-fat mayonnaise and salad dressings (reduced sodium). Avocado. Safflower, olive, or canola oils. Natural peanut or almond butter.  Other  Unsalted popcorn and pretzels. The items listed above may not be a complete list of recommended foods or beverages. Contact your dietitian for more options.  +++++++++++++++++++++++++++++++++++++++++++  WHAT FOODS ARE NOT RECOMMENDED?  Grains/ White flour or wheat flour  White bread. White pasta. White rice. Refined cornbread. Bagels and croissants. Crackers that contain trans fat.  Vegetables  Creamed or fried vegetables. Vegetables in a . Regular canned vegetables. Regular canned tomato sauce and paste. Regular tomato and vegetable juices.  Fruits  Dried fruits. Canned fruit in light or heavy syrup. Fruit juice.  Meat and Other Protein Products  Meat in general - RED mwaet & White meat.  Fatty cuts of meat. Ribs, chicken wings, bacon, sausage, bologna, salami, chitterlings, fatback, hot dogs, bratwurst, and packaged luncheon meats.  Dairy  Whole or 2% milk, cream, half-and-half, and cream cheese. Whole-fat or sweetened yogurt. Full-fat cheeses or blue cheese. Nondairy creamers and whipped toppings. Processed cheese, cheese spreads, or  cheese curds.  Condiments  Onion and garlic salt, seasoned salt, table salt, and sea salt. Canned and packaged gravies. Worcestershire sauce. Tartar sauce. Barbecue sauce. Teriyaki sauce. Soy sauce, including reduced sodium. Steak sauce. Fish sauce. Oyster sauce. Cocktail sauce. Horseradish. Ketchup and mustard. Meat flavorings and tenderizers. Bouillon cubes. Hot sauce. Tabasco sauce. Marinades. Taco seasonings. Relishes.  Fats and Oils Butter, stick margarine, lard, shortening and bacon fat. Coconut, palm kernel, or palm oils. Regular salad dressings.  Pickles and olives. Salted popcorn and pretzels.  The items listed above may not be a complete list of foods and beverages to  avoid.  ++++++++++++++++++++++++++++++++++++++  Preventive Care for Adults  A healthy lifestyle and preventive care can promote health and wellness. Preventive health guidelines for men include the following key practices:  A routine yearly physical is a good way to check with your health care provider about your health and preventative screening. It is a chance to share any concerns and updates on your health and to receive a thorough exam.  Visit your dentist for a routine exam and preventative care every 6 months. Brush your teeth twice a day and floss once a day. Good oral hygiene prevents tooth decay and gum disease.  The frequency of eye exams is based on your age, health, family medical history, use of contact lenses, and other factors. Follow your health care provider's recommendations for frequency of eye exams.  Eat a healthy diet. Foods such as vegetables, fruits, whole grains, low-fat dairy products, and lean protein foods contain the nutrients you need without too many calories. Decrease your intake of foods high in solid fats, added sugars, and salt. Eat the right amount of calories for you.Get information about a proper diet from your health care provider, if necessary.  Regular physical exercise is one of the most important things you can do for your health. Most adults should get at least 150 minutes of moderate-intensity exercise (any activity that increases your heart rate and causes you to sweat) each week. In addition, most adults need muscle-strengthening exercises on 2 or more days a week.  Maintain a healthy weight. The body mass index (BMI) is a screening tool to identify possible weight problems. It provides an estimate of body fat based on height and weight. Your health care provider can find your BMI and can help you achieve or maintain a healthy weight.For adults 20 years and older:  A BMI below 18.5 is considered underweight.  A BMI of 18.5 to 24.9 is  normal.  A BMI of 25 to 29.9 is considered overweight.  A BMI of 30 and above is considered obese.  Maintain normal blood lipids and cholesterol levels by exercising and minimizing your intake of saturated fat. Eat a balanced diet with plenty of fruit and vegetables. Blood tests for lipids and cholesterol should begin at age 20 and be repeated every 5 years. If your lipid or cholesterol levels are high, you are over 50, or you are at high risk for heart disease, you may need your cholesterol levels checked more frequently.Ongoing high lipid and cholesterol levels should be treated with medicines if diet and exercise are not working.  If you smoke, find out from your health care provider how to quit. If you do not use tobacco, do not start.  Lung cancer screening is recommended for adults aged 55-80 years who are at high risk for developing lung cancer because of a history of smoking. A yearly low-dose CT scan of the lungs   is recommended for people who have at least a 30-pack-year history of smoking and are a current smoker or have quit within the past 15 years. A pack year of smoking is smoking an average of 1 pack of cigarettes a day for 1 year (for example: 1 pack a day for 30 years or 2 packs a day for 15 years). Yearly screening should continue until the smoker has stopped smoking for at least 15 years. Yearly screening should be stopped for people who develop a health problem that would prevent them from having lung cancer treatment.  If you choose to drink alcohol, do not have more than 2 drinks per day. One drink is considered to be 12 ounces (355 mL) of beer, 5 ounces (148 mL) of wine, or 1.5 ounces (44 mL) of liquor.  High blood pressure causes heart disease and increases the risk of stroke. Your blood pressure should be checked. Ongoing high blood pressure should be treated with medicines, if weight loss and exercise are not effective.  If you are 45-79 years old, ask your health care  provider if you should take aspirin to prevent heart disease.  Diabetes screening involves taking a blood sample to check your fasting blood sugar level. Testing should be considered at a younger age or be carried out more frequently if you are overweight and have at least 1 risk factor for diabetes.  Colorectal cancer can be detected and often prevented. Most routine colorectal cancer screening begins at the age of 50 and continues through age 75. However, your health care provider may recommend screening at an earlier age if you have risk factors for colon cancer. On a yearly basis, your health care provider may provide home test kits to check for hidden blood in the stool. Use of a small camera at the end of a tube to directly examine the colon (sigmoidoscopy or colonoscopy) can detect the earliest forms of colorectal cancer. Talk to your health care provider about this at age 50, when routine screening begins. Direct exam of the colon should be repeated every 5-10 years through age 75, unless early forms of precancerous polyps or small growths are found.  Screening for abdominal aortic aneurysm (AAA)  are recommended for persons over age 50 who have history of hypertensionor who are current or former smokers.  Talk with your health care provider about prostate cancer screening.  Testicular cancer screening is recommended for adult males. Screening includes self-exam, a health care provider exam, and other screening tests. Consult with your health care provider about any symptoms you have or any concerns you have about testicular cancer.  Use sunscreen. Apply sunscreen liberally and repeatedly throughout the day. You should seek shade when your shadow is shorter than you. Protect yourself by wearing long sleeves, pants, a wide-brimmed hat, and sunglasses year round, whenever you are outdoors.  Once a month, do a whole-body skin exam, using a mirror to look at the skin on your back. Tell your health  care provider about new moles, moles that have irregular borders, moles that are larger than a pencil eraser, or moles that have changed in shape or color.  Stay current with required vaccines (immunizations).  Influenza vaccine. All adults should be immunized every year.  Tetanus, diphtheria, and acellular pertussis (Td, Tdap) vaccine. An adult who has not previously received Tdap or who does not know his vaccine status should receive 1 dose of Tdap. This initial dose should be followed by tetanus and diphtheria toxoids (Td)   booster doses every 10 years. Adults with an unknown or incomplete history of completing a 3-dose immunization series with Td-containing vaccines should begin or complete a primary immunization series including a Tdap dose. Adults should receive a Td booster every 10 years.  Zoster vaccine. One dose is recommended for adults aged 60 years or older unless certain conditions are present.    Pneumococcal 13-valent conjugate (PCV13) vaccine. When indicated, a person who is uncertain of his immunization history and has no record of immunization should receive the PCV13 vaccine. An adult aged 19 years or older who has certain medical conditions and has not been previously immunized should receive 1 dose of PCV13 vaccine. This PCV13 should be followed with a dose of pneumococcal polysaccharide (PPSV23) vaccine. The PPSV23 vaccine dose should be obtained at least 8 weeks after the dose of PCV13 vaccine. An adult aged 19 years or older who has certain medical conditions and previously received 1 or more doses of PPSV23 vaccine should receive 1 dose of PCV13. The PCV13 vaccine dose should be obtained 1 or more years after the last PPSV23 vaccine dose.    Pneumococcal polysaccharide (PPSV23) vaccine. When PCV13 is also indicated, PCV13 should be obtained first. All adults aged 65 years and older should be immunized. An adult younger than age 65 years who has certain medical conditions  should be immunized. Any person who resides in a nursing home or long-term care facility should be immunized. An adult smoker should be immunized. People with an immunocompromised condition and certain other conditions should receive both PCV13 and PPSV23 vaccines. People with human immunodeficiency virus (HIV) infection should be immunized as soon as possible after diagnosis. Immunization during chemotherapy or radiation therapy should be avoided. Routine use of PPSV23 vaccine is not recommended for American Indians, [place] Natives, or people younger than 65 years unless there are medical conditions that require PPSV23 vaccine. When indicated, people who have unknown immunization and have no record of immunization should receive PPSV23 vaccine. One-time revaccination 5 years after the first dose of PPSV23 is recommended for people aged 19-64 years who have chronic kidney failure, nephrotic syndrome, asplenia, or immunocompromised conditions. People who received 1-2 doses of PPSV23 before age 65 years should receive another dose of PPSV23 vaccine at age 65 years or later if at least 5 years have passed since the previous dose. Doses of PPSV23 are not needed for people immunized with PPSV23 at or after age 65 years.  Hepatitis A vaccine. Adults who wish to be protected from this disease, have certain high-risk conditions, work with hepatitis A-infected animals, work in hepatitis A research labs, or travel to or work in countries with a high rate of hepatitis A should be immunized. Adults who were previously unvaccinated and who anticipate close contact with an international adoptee during the first 60 days after arrival in the [place] from a country with a high rate of hepatitis A should be immunized.  Hepatitis B vaccine. Adults should be immunized if they wish to be protected from this disease, have certain high-risk conditions, may be exposed to blood or other infectious body fluids, are household  contacts or sex partners of hepatitis B positive people, are clients or workers in certain care facilities, or travel to or work in countries with a high rate of hepatitis B.  Preventive Service / Frequency  Ages 19 to 39  Blood pressure check.  Lipid and cholesterol check.  Hepatitis C blood test.** / For any individual with known risks   for hepatitis C.  Skin self-exam. / Monthly.  Influenza vaccine. / Every year.  Tetanus, diphtheria, and acellular pertussis (Tdap, Td) vaccine.** / Consult your health care provider. 1 dose of Td every 10 years.  HPV vaccine. / 3 doses over 6 months, if 26 or younger.  Measles, mumps, rubella (MMR) vaccine.** / You need at least 1 dose of MMR if you were born in 1957 or later. You may also need a second dose.  Pneumococcal 13-valent conjugate (PCV13) vaccine.** / Consult your health care provider.  Pneumococcal polysaccharide (PPSV23) vaccine.** / 1 to 2 doses if you smoke cigarettes or if you have certain conditions.  Meningococcal vaccine.** / 1 dose if you are age 19 to 21 years and a first-year college student living in a residence hall, or have one of several medical conditions. You may also need additional booster doses.  Hepatitis A vaccine.** / Consult your health care provider.  Hepatitis B vaccine.** / Consult your health care provider.   

## 2015-08-15 LAB — TESTOSTERONE: TESTOSTERONE: 350 ng/dL (ref 250–827)

## 2015-08-15 LAB — BASIC METABOLIC PANEL WITH GFR
BUN: 11 mg/dL (ref 7–25)
CALCIUM: 9.3 mg/dL (ref 8.6–10.3)
CO2: 26 mmol/L (ref 20–31)
Chloride: 102 mmol/L (ref 98–110)
Creat: 0.91 mg/dL (ref 0.60–1.35)
GFR, Est Non African American: >89 mL/min (ref >=60)
Glucose, Bld: 63 mg/dL — ABNORMAL LOW (ref 65–99)
POTASSIUM: 4.1 mmol/L (ref 3.5–5.3)
SODIUM: 139 mmol/L (ref 135–146)

## 2015-08-15 LAB — INSULIN, RANDOM: Insulin: 10.3 u[IU]/mL (ref 2.0–19.6)

## 2015-08-15 LAB — URINALYSIS, ROUTINE W REFLEX MICROSCOPIC
Bilirubin Urine: NEGATIVE
Glucose, UA: NEGATIVE
Hgb urine dipstick: NEGATIVE
Ketones, ur: NEGATIVE
Leukocytes, UA: NEGATIVE
NITRITE: NEGATIVE
PROTEIN: NEGATIVE
SPECIFIC GRAVITY, URINE: 1.018 (ref 1.001–1.035)
pH: 7 (ref 5.0–8.0)

## 2015-08-15 LAB — MAGNESIUM: Magnesium: 2.1 mg/dL (ref 1.5–2.5)

## 2015-08-15 LAB — IRON AND TIBC
%SAT: 37 % (ref 15–60)
Iron: 125 ug/dL (ref 50–195)
TIBC: 338 ug/dL (ref 250–425)
UIBC: 213 ug/dL (ref 125–400)

## 2015-08-15 LAB — LIPID PANEL
CHOL/HDL RATIO: 3.1 ratio (ref <=5.0)
CHOLESTEROL: 181 mg/dL (ref 125–200)
HDL: 58 mg/dL (ref >=40)
LDL CALC: 104 mg/dL (ref <130)
TRIGLYCERIDES: 97 mg/dL (ref <150)
VLDL: 19 mg/dL (ref <30)

## 2015-08-15 LAB — HEPATIC FUNCTION PANEL
ALK PHOS: 48 U/L (ref 40–115)
ALT: 13 U/L (ref 9–46)
AST: 17 U/L (ref 10–40)
Albumin: 4.5 g/dL (ref 3.6–5.1)
BILIRUBIN DIRECT: 0.2 mg/dL (ref <=0.2)
BILIRUBIN INDIRECT: 0.8 mg/dL (ref 0.2–1.2)
Total Bilirubin: 1 mg/dL (ref 0.2–1.2)
Total Protein: 7.3 g/dL (ref 6.1–8.1)

## 2015-08-15 LAB — HEMOGLOBIN A1C
Hgb A1c MFr Bld: 5.1 % (ref <5.7)
Mean Plasma Glucose: 100 mg/dL

## 2015-08-15 LAB — VITAMIN D 25 HYDROXY (VIT D DEFICIENCY, FRACTURES): Vit D, 25-Hydroxy: 33 ng/mL (ref 30–100)

## 2015-08-16 LAB — TB SKIN TEST
Induration: 0 mm
TB Skin Test: NEGATIVE

## 2015-09-06 ENCOUNTER — Encounter: Payer: Self-pay | Admitting: Internal Medicine

## 2016-09-21 ENCOUNTER — Ambulatory Visit: Payer: Self-pay | Admitting: Internal Medicine

## 2016-09-21 NOTE — Progress Notes (Signed)
NO SHOW

## 2017-01-06 ENCOUNTER — Ambulatory Visit: Payer: Self-pay | Admitting: Internal Medicine

## 2017-01-06 NOTE — Progress Notes (Signed)
NO SHOW

## 2017-10-11 ENCOUNTER — Encounter: Payer: Self-pay | Admitting: Internal Medicine

## 2018-02-22 ENCOUNTER — Encounter: Payer: Self-pay | Admitting: Internal Medicine
